# Patient Record
Sex: Male | Born: 1985 | Race: Black or African American | Hispanic: No | Marital: Single | Smoking: Never smoker
Health system: Southern US, Community
[De-identification: ages and names within clinical notes are randomized; demographics above are authoritative.]

## PROBLEM LIST (undated history)

## (undated) DIAGNOSIS — I10 Essential (primary) hypertension: Secondary | ICD-10-CM

## (undated) HISTORY — PX: NO PAST SURGERIES: SHX2092

---
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
## 2006-03-13 ENCOUNTER — Ambulatory Visit: Payer: Self-pay | Admitting: Internal Medicine

## 2006-04-24 ENCOUNTER — Ambulatory Visit: Payer: Self-pay | Admitting: Internal Medicine

## 2006-05-19 ENCOUNTER — Ambulatory Visit: Payer: Self-pay | Admitting: Internal Medicine

## 2006-10-06 DIAGNOSIS — L259 Unspecified contact dermatitis, unspecified cause: Secondary | ICD-10-CM

## 2006-12-14 ENCOUNTER — Emergency Department (HOSPITAL_COMMUNITY): Admission: EM | Admit: 2006-12-14 | Discharge: 2006-12-14 | Payer: Self-pay | Admitting: Emergency Medicine

## 2007-01-21 ENCOUNTER — Emergency Department (HOSPITAL_COMMUNITY): Admission: EM | Admit: 2007-01-21 | Discharge: 2007-01-21 | Payer: Self-pay | Admitting: Emergency Medicine

## 2007-01-25 ENCOUNTER — Encounter (INDEPENDENT_AMBULATORY_CARE_PROVIDER_SITE_OTHER): Payer: Self-pay | Admitting: Internal Medicine

## 2007-01-25 ENCOUNTER — Emergency Department (HOSPITAL_COMMUNITY): Admission: EM | Admit: 2007-01-25 | Discharge: 2007-01-25 | Payer: Self-pay | Admitting: Emergency Medicine

## 2007-01-25 DIAGNOSIS — A539 Syphilis, unspecified: Secondary | ICD-10-CM | POA: Insufficient documentation

## 2007-04-13 ENCOUNTER — Encounter (INDEPENDENT_AMBULATORY_CARE_PROVIDER_SITE_OTHER): Payer: Self-pay | Admitting: *Deleted

## 2007-04-27 ENCOUNTER — Telehealth (INDEPENDENT_AMBULATORY_CARE_PROVIDER_SITE_OTHER): Payer: Self-pay | Admitting: Internal Medicine

## 2007-07-02 ENCOUNTER — Ambulatory Visit: Payer: Self-pay | Admitting: Internal Medicine

## 2007-07-02 DIAGNOSIS — L738 Other specified follicular disorders: Secondary | ICD-10-CM

## 2007-07-02 DIAGNOSIS — F319 Bipolar disorder, unspecified: Secondary | ICD-10-CM

## 2007-07-02 LAB — CONVERTED CEMR LAB
ALT: 11 units/L (ref 0–53)
Alkaline Phosphatase: 75 units/L (ref 39–117)
BUN: 14 mg/dL (ref 6–23)
Basophils Absolute: 0 10*3/uL (ref 0.0–0.1)
Blood in Urine, dipstick: NEGATIVE
CO2: 25 meq/L (ref 19–32)
Eosinophils Absolute: 0.2 10*3/uL (ref 0.0–0.7)
Eosinophils Relative: 3 % (ref 0–5)
Glucose, Urine, Semiquant: NEGATIVE
HCT: 47.1 % (ref 39.0–52.0)
Lymphocytes Relative: 47 % — ABNORMAL HIGH (ref 12–46)
Lymphs Abs: 2.1 10*3/uL (ref 0.7–4.0)
MCHC: 32.3 g/dL (ref 30.0–36.0)
MCV: 84 fL (ref 78.0–100.0)
Monocytes Absolute: 0.5 10*3/uL (ref 0.1–1.0)
Neutrophils Relative %: 38 % — ABNORMAL LOW (ref 43–77)
Nitrite: NEGATIVE
Platelets: 258 10*3/uL (ref 150–400)
RDW: 14.9 % (ref 11.5–15.5)
Sodium: 142 meq/L (ref 135–145)
Total Protein: 7.3 g/dL (ref 6.0–8.3)
WBC: 4.5 10*3/uL (ref 4.0–10.5)
pH: 6

## 2007-07-04 ENCOUNTER — Encounter (INDEPENDENT_AMBULATORY_CARE_PROVIDER_SITE_OTHER): Payer: Self-pay | Admitting: Internal Medicine

## 2007-07-05 ENCOUNTER — Ambulatory Visit: Payer: Self-pay | Admitting: Internal Medicine

## 2007-07-08 ENCOUNTER — Ambulatory Visit: Payer: Self-pay | Admitting: Internal Medicine

## 2007-07-22 ENCOUNTER — Ambulatory Visit: Payer: Self-pay | Admitting: Internal Medicine

## 2007-08-10 DIAGNOSIS — F4310 Post-traumatic stress disorder, unspecified: Secondary | ICD-10-CM

## 2007-08-19 ENCOUNTER — Ambulatory Visit: Payer: Self-pay | Admitting: Internal Medicine

## 2007-08-25 ENCOUNTER — Emergency Department (HOSPITAL_COMMUNITY): Admission: EM | Admit: 2007-08-25 | Discharge: 2007-08-25 | Payer: Self-pay | Admitting: Emergency Medicine

## 2007-09-06 ENCOUNTER — Ambulatory Visit: Payer: Self-pay | Admitting: Internal Medicine

## 2007-12-09 ENCOUNTER — Emergency Department (HOSPITAL_COMMUNITY): Admission: EM | Admit: 2007-12-09 | Discharge: 2007-12-09 | Payer: Self-pay | Admitting: Family Medicine

## 2007-12-26 ENCOUNTER — Emergency Department (HOSPITAL_COMMUNITY): Admission: EM | Admit: 2007-12-26 | Discharge: 2007-12-26 | Payer: Self-pay | Admitting: Emergency Medicine

## 2007-12-29 ENCOUNTER — Emergency Department (HOSPITAL_COMMUNITY): Admission: EM | Admit: 2007-12-29 | Discharge: 2007-12-29 | Payer: Self-pay | Admitting: Emergency Medicine

## 2008-01-08 ENCOUNTER — Emergency Department (HOSPITAL_COMMUNITY): Admission: EM | Admit: 2008-01-08 | Discharge: 2008-01-08 | Payer: Self-pay | Admitting: Emergency Medicine

## 2008-01-20 ENCOUNTER — Emergency Department (HOSPITAL_COMMUNITY): Admission: EM | Admit: 2008-01-20 | Discharge: 2008-01-20 | Payer: Self-pay | Admitting: Family Medicine

## 2008-04-05 ENCOUNTER — Emergency Department (HOSPITAL_COMMUNITY): Admission: EM | Admit: 2008-04-05 | Discharge: 2008-04-05 | Payer: Self-pay | Admitting: Emergency Medicine

## 2008-06-04 ENCOUNTER — Emergency Department (HOSPITAL_COMMUNITY): Admission: EM | Admit: 2008-06-04 | Discharge: 2008-06-04 | Payer: Self-pay | Admitting: Emergency Medicine

## 2008-06-20 ENCOUNTER — Emergency Department (HOSPITAL_COMMUNITY): Admission: EM | Admit: 2008-06-20 | Discharge: 2008-06-20 | Payer: Self-pay | Admitting: Emergency Medicine

## 2008-06-22 ENCOUNTER — Emergency Department (HOSPITAL_COMMUNITY): Admission: EM | Admit: 2008-06-22 | Discharge: 2008-06-22 | Payer: Self-pay | Admitting: Emergency Medicine

## 2008-07-06 ENCOUNTER — Encounter (INDEPENDENT_AMBULATORY_CARE_PROVIDER_SITE_OTHER): Payer: Self-pay | Admitting: Internal Medicine

## 2009-04-29 ENCOUNTER — Emergency Department (HOSPITAL_COMMUNITY): Admission: EM | Admit: 2009-04-29 | Discharge: 2009-04-29 | Payer: Self-pay | Admitting: Family Medicine

## 2009-07-07 ENCOUNTER — Emergency Department (HOSPITAL_COMMUNITY): Admission: EM | Admit: 2009-07-07 | Discharge: 2009-07-07 | Payer: Self-pay | Admitting: Emergency Medicine

## 2009-07-17 ENCOUNTER — Emergency Department (HOSPITAL_COMMUNITY): Admission: EM | Admit: 2009-07-17 | Discharge: 2009-07-17 | Payer: Self-pay | Admitting: Family Medicine

## 2009-09-04 IMAGING — CR DG CHEST 2V
2 series · 2 of 2 positions shown · non-contrast
Comparison: None

CLINICAL DATA: Chest pain cough.

CHEST - 2 VIEW

[w chest pa]
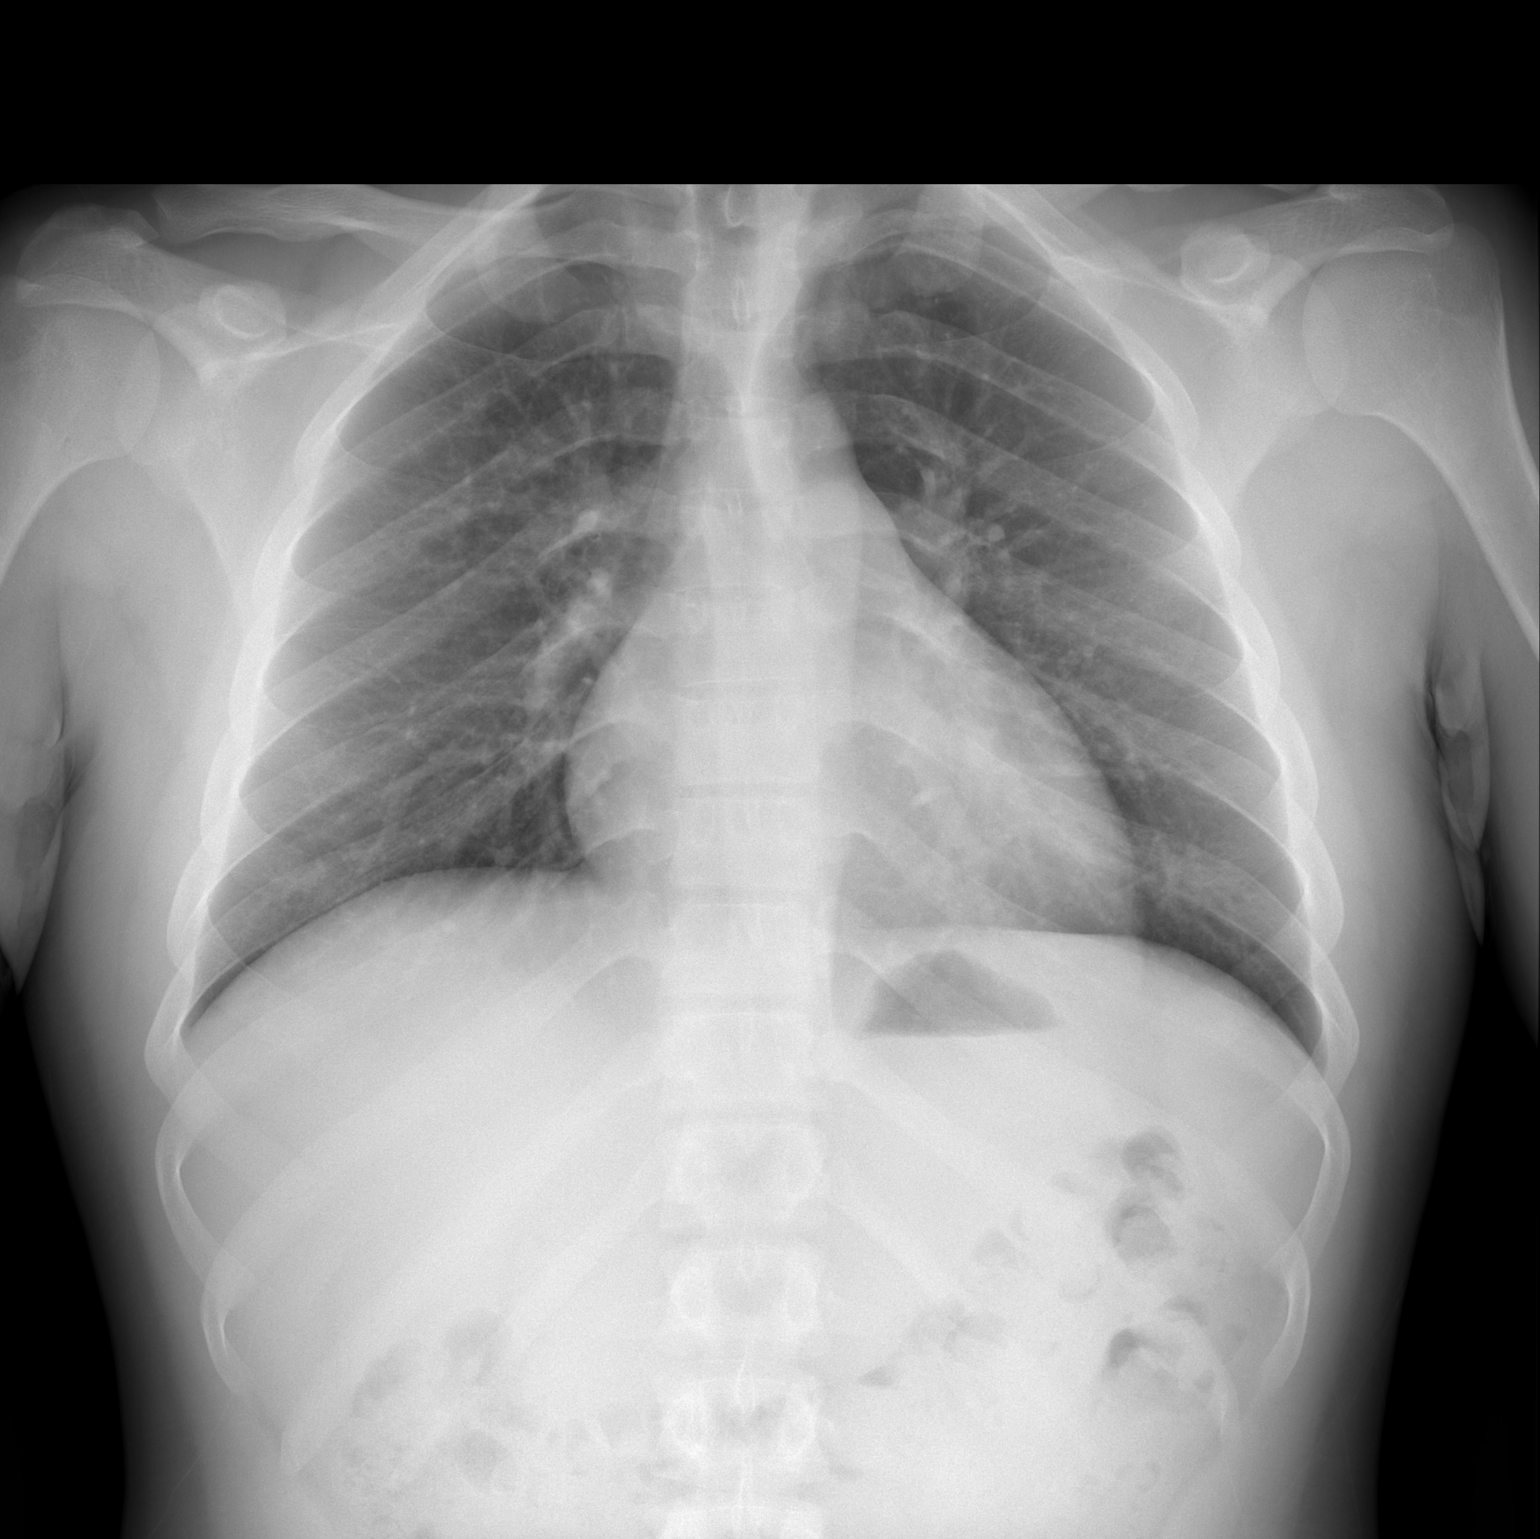

[w chest lat]
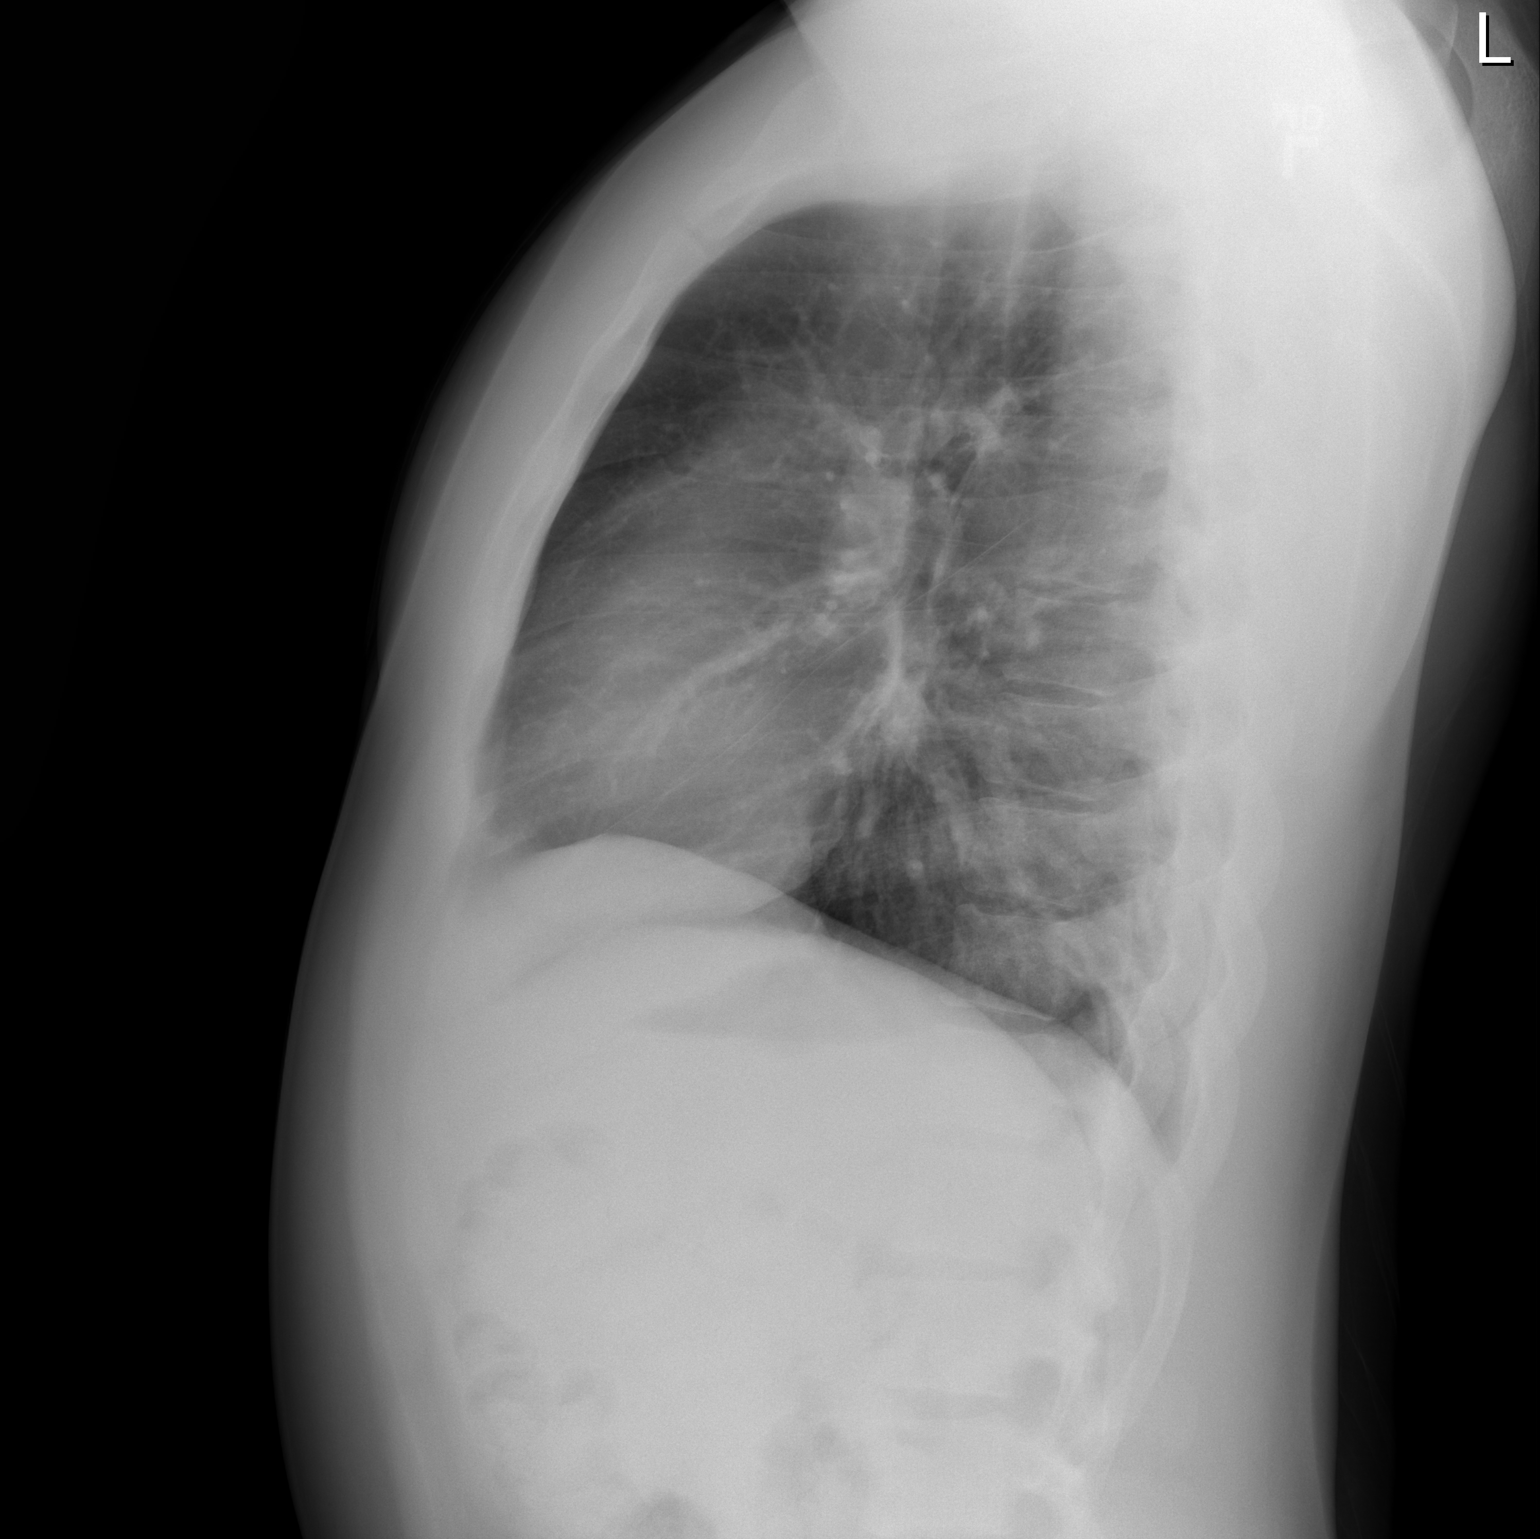

[2 of 2 positions shown; findings below may reference images not displayed]

FINDINGS: The heart size and mediastinal contours are within normal
limits.  Both lungs are clear.  The visualized skeletal structures
are unremarkable.
IMPRESSION: No active cardiopulmonary disease.
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
## 2010-04-10 ENCOUNTER — Emergency Department (HOSPITAL_COMMUNITY)
Admission: EM | Admit: 2010-04-10 | Discharge: 2010-04-10 | Discharge status: Against Medical Advice | Payer: Self-pay | Attending: Emergency Medicine | Admitting: Emergency Medicine

## 2010-04-10 ENCOUNTER — Emergency Department (HOSPITAL_COMMUNITY): Payer: Self-pay

## 2010-04-10 DIAGNOSIS — S01409A Unspecified open wound of unspecified cheek and temporomandibular area, initial encounter: Secondary | ICD-10-CM | POA: Insufficient documentation

## 2010-04-10 DIAGNOSIS — IMO0002 Reserved for concepts with insufficient information to code with codable children: Secondary | ICD-10-CM | POA: Insufficient documentation

## 2010-11-01 LAB — POCT URINALYSIS DIP (DEVICE)
Glucose, UA: NEGATIVE
Hgb urine dipstick: NEGATIVE
Operator id: 239701
Protein, ur: NEGATIVE

## 2010-11-06 LAB — URINALYSIS, ROUTINE W REFLEX MICROSCOPIC
Bilirubin Urine: NEGATIVE
Glucose, UA: NEGATIVE
Hgb urine dipstick: NEGATIVE
Ketones, ur: NEGATIVE
Nitrite: NEGATIVE
Protein, ur: NEGATIVE
pH: 6.5

## 2010-11-08 LAB — HIV ANTIBODY (ROUTINE TESTING W REFLEX): HIV: NONREACTIVE

## 2010-11-08 LAB — GC/CHLAMYDIA PROBE AMP, GENITAL: Chlamydia, DNA Probe: NEGATIVE

## 2010-11-08 LAB — T.PALLIDUM AB, IGG: T pallidum Antibodies (TP-PA): >8 — ABNORMAL HIGH

## 2010-11-08 LAB — RPR: RPR Ser Ql: REACTIVE — AB

## 2010-11-08 LAB — HERPES SIMPLEX VIRUS CULTURE: Culture: NOT DETECTED

## 2012-10-31 ENCOUNTER — Encounter (HOSPITAL_COMMUNITY): Payer: Self-pay | Admitting: *Deleted

## 2012-10-31 ENCOUNTER — Emergency Department (HOSPITAL_COMMUNITY)
Admission: EM | Admit: 2012-10-31 | Discharge: 2012-10-31 | Disposition: A | Discharge status: Home / Self Care | Payer: Self-pay | Attending: Emergency Medicine | Admitting: Emergency Medicine

## 2012-10-31 DIAGNOSIS — R319 Hematuria, unspecified: Secondary | ICD-10-CM | POA: Insufficient documentation

## 2012-10-31 DIAGNOSIS — Z880 Allergy status to penicillin: Secondary | ICD-10-CM | POA: Insufficient documentation

## 2012-10-31 DIAGNOSIS — R000 Tachycardia, unspecified: Secondary | ICD-10-CM | POA: Insufficient documentation

## 2012-10-31 DIAGNOSIS — R109 Unspecified abdominal pain: Secondary | ICD-10-CM | POA: Insufficient documentation

## 2012-10-31 DIAGNOSIS — M549 Dorsalgia, unspecified: Secondary | ICD-10-CM | POA: Insufficient documentation

## 2012-10-31 LAB — URINALYSIS, ROUTINE W REFLEX MICROSCOPIC
Bilirubin Urine: NEGATIVE
Ketones, ur: NEGATIVE mg/dL
Leukocytes, UA: NEGATIVE
Specific Gravity, Urine: 1.035 — ABNORMAL HIGH (ref 1.005–1.030)
Urobilinogen, UA: 1 mg/dL (ref 0.0–1.0)

## 2012-10-31 LAB — URINE MICROSCOPIC-ADD ON

## 2012-10-31 MED ORDER — KETOROLAC TROMETHAMINE 60 MG/2ML IM SOLN
60.0000 mg | Freq: Once | INTRAMUSCULAR | Status: AC
  Administered 2012-10-31: 60 mg via INTRAMUSCULAR
  Filled 2012-10-31: qty 2

## 2012-10-31 MED ORDER — LIDOCAINE HCL (PF) 1 % IJ SOLN: 2.0000 mL | Freq: Once | INTRAMUSCULAR | Status: DC

## 2012-10-31 MED ORDER — HYDROCODONE-ACETAMINOPHEN 5-325 MG PO TABS
2.0000 | ORAL_TABLET | Freq: Once | ORAL | Status: AC
  Administered 2012-10-31: 2 via ORAL
  Filled 2012-10-31: qty 2

## 2012-10-31 MED ORDER — CEFTRIAXONE SODIUM 250 MG IJ SOLR
250.0000 mg | Freq: Once | INTRAMUSCULAR | Status: AC
  Administered 2012-10-31: 250 mg via INTRAMUSCULAR
  Filled 2012-10-31: qty 250

## 2012-10-31 MED ORDER — HYDROCODONE-ACETAMINOPHEN 5-325 MG PO TABS: 1.0000 | ORAL_TABLET | ORAL | Status: DC | PRN

## 2012-10-31 NOTE — ED Notes (Signed)
Left flank pain x 2 days ago.- felt like it was cramps. 0400: hematuria.

## 2012-10-31 NOTE — ED Provider Notes (Signed)
CSN: [id number]     Arrival date & time 10/31/12  0602 History   First MD Initiated Contact with Patient 10/31/12 0606     Chief Complaint  Patient presents with  . Hematuria   (Consider location/radiation/quality/duration/timing/severity/associated sxs/prior Treatment) HPI Comments: 27 yo male with syphilis hx presents with left flank pain and dysuria for two days.  Pt has had an std but this is different> no known or FH of kidney stones.  Pain sharp, radiates to groin.  Improved recently.  Blood in urine, new.  Pt is sexually active rectal penetration, one partner.  No discharge or fevers.   Patient is a 27 y.o. male presenting with hematuria. The history is provided by the patient.  Hematuria This is a new problem. Pertinent negatives include no abdominal pain.    No past medical history on file. No past surgical history on file. No family history on file. History  Substance Use Topics  . Smoking status: Not on file  . Smokeless tobacco: Not on file  . Alcohol Use: Not on file    Review of Systems  Constitutional: Negative for fever and chills.  Gastrointestinal: Negative for vomiting and abdominal pain.  Genitourinary: Positive for hematuria and flank pain. Negative for discharge, penile swelling, scrotal swelling and penile pain.  Musculoskeletal: Positive for back pain.  Skin: Negative for rash.    Allergies  Penicillins  Home Medications  No current outpatient prescriptions on file. BP 148/93  Pulse 103  Temp(Src) 98.3 F (36.8 C) (Oral)  Resp 18  SpO2 99% Physical Exam  Nursing note and vitals reviewed. Constitutional: He is oriented to person, place, and time. He appears well-developed and well-nourished.  HENT:  Head: Normocephalic and atraumatic.  Eyes: Conjunctivae are normal. Right eye exhibits no discharge. Left eye exhibits no discharge.  Neck: Normal range of motion. Neck supple. No tracheal deviation present.  Cardiovascular: Regular rhythm.   Tachycardia present.   Pulmonary/Chest: Effort normal.  Abdominal: Soft. He exhibits no distension. There is no tenderness. There is no guarding.  Left flank pain  Neurological: He is alert and oriented to person, place, and time.  Skin: Skin is warm. No rash noted.  Psychiatric: He has a normal mood and affect.    ED Course  Procedures (including critical care time) Emergency Focused Ultrasound Exam Limited retroperitoneal ultrasound of kidneys  Performed and interpreted by Dr. Jodi Mourning Indication: left flank pain Focused abdominal ultrasound with both kidneys imaged in transverse and longitudinal planes in real-time. Interpretation: no hydronephrosis visualized.  no stones or cysts visualized  Images archived electronically  Labs Review Labs Reviewed  URINALYSIS, ROUTINE W REFLEX MICROSCOPIC - Abnormal; Notable for the following:    APPearance CLOUDY (*)    Specific Gravity, Urine 1.035 (*)    Hgb urine dipstick MODERATE (*)    Protein, ur 30 (*)    All other components within normal limits  URINE MICROSCOPIC-ADD ON   Imaging Review No results found.  MDM  No diagnosis found. Clinically stone, may have passed. With std hx, rocephin given prophylactic IM. Pt will fup closely with pcp.  Pt comfortable on exam/ Toradol and norco for pain. Pain improved in ED.  No infection in UA.  Culture sent.  Bedside US no hydronephrosis, with pain improved, young male, left side. Will hold on CT at this point. Strict return instructions given.   Flank pain, hematuria   Enid Skeens, MD 10/31/12 [phone]

## 2013-08-09 ENCOUNTER — Encounter (HOSPITAL_COMMUNITY): Payer: Self-pay | Admitting: Emergency Medicine

## 2013-08-09 ENCOUNTER — Emergency Department (HOSPITAL_COMMUNITY)
Admission: EM | Admit: 2013-08-09 | Discharge: 2013-08-09 | Disposition: A | Discharge status: Home / Self Care | Payer: No Typology Code available for payment source | Attending: Emergency Medicine | Admitting: Emergency Medicine

## 2013-08-09 ENCOUNTER — Emergency Department (HOSPITAL_COMMUNITY): Payer: No Typology Code available for payment source

## 2013-08-09 DIAGNOSIS — S99919A Unspecified injury of unspecified ankle, initial encounter: Secondary | ICD-10-CM

## 2013-08-09 DIAGNOSIS — Y9389 Activity, other specified: Secondary | ICD-10-CM | POA: Diagnosis not present

## 2013-08-09 DIAGNOSIS — Y92410 Unspecified street and highway as the place of occurrence of the external cause: Secondary | ICD-10-CM | POA: Diagnosis not present

## 2013-08-09 DIAGNOSIS — S199XXA Unspecified injury of neck, initial encounter: Secondary | ICD-10-CM

## 2013-08-09 DIAGNOSIS — Z880 Allergy status to penicillin: Secondary | ICD-10-CM | POA: Diagnosis not present

## 2013-08-09 DIAGNOSIS — IMO0002 Reserved for concepts with insufficient information to code with codable children: Secondary | ICD-10-CM | POA: Diagnosis present

## 2013-08-09 DIAGNOSIS — S99929A Unspecified injury of unspecified foot, initial encounter: Secondary | ICD-10-CM

## 2013-08-09 DIAGNOSIS — M25562 Pain in left knee: Secondary | ICD-10-CM

## 2013-08-09 DIAGNOSIS — S0993XA Unspecified injury of face, initial encounter: Secondary | ICD-10-CM | POA: Insufficient documentation

## 2013-08-09 DIAGNOSIS — S0990XA Unspecified injury of head, initial encounter: Secondary | ICD-10-CM | POA: Diagnosis not present

## 2013-08-09 DIAGNOSIS — S8990XA Unspecified injury of unspecified lower leg, initial encounter: Secondary | ICD-10-CM | POA: Insufficient documentation

## 2013-08-09 DIAGNOSIS — M545 Low back pain: Secondary | ICD-10-CM

## 2013-08-09 MED ORDER — DIAZEPAM 5 MG PO TABS
10.0000 mg | ORAL_TABLET | Freq: Once | ORAL | Status: AC
  Administered 2013-08-09: 10 mg via ORAL
  Filled 2013-08-09: qty 2

## 2013-08-09 MED ORDER — DIAZEPAM 5 MG PO TABS: 5.0000 mg | ORAL_TABLET | Freq: Two times a day (BID) | ORAL | Status: DC

## 2013-08-09 MED ORDER — NAPROXEN 500 MG PO TABS
500.0000 mg | ORAL_TABLET | Freq: Once | ORAL | Status: AC
  Administered 2013-08-09: 500 mg via ORAL
  Filled 2013-08-09: qty 1

## 2013-08-09 MED ORDER — NAPROXEN 500 MG PO TABS: 500.0000 mg | ORAL_TABLET | Freq: Two times a day (BID) | ORAL | Status: DC
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
## 2013-08-09 NOTE — Progress Notes (Signed)
[facility] provided pt with a list of primary care resources and a [facility] [employer] Card application to help patient establish primary care.  °
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
## 2013-08-09 NOTE — ED Notes (Signed)
Ortho tech at bedside 

## 2013-08-10 NOTE — ED Provider Notes (Signed)
Medical screening examination/treatment/procedure(s) were performed by non-physician practitioner and as supervising physician I was immediately available for consultation/collaboration.   EKG Interpretation None        William Jens Siems, MD 08/10/13 1640 

## 2013-08-11 ENCOUNTER — Emergency Department (HOSPITAL_COMMUNITY)
Admission: EM | Admit: 2013-08-11 | Discharge: 2013-08-11 | Disposition: A | Discharge status: Home / Self Care | Payer: No Typology Code available for payment source | Attending: Emergency Medicine | Admitting: Emergency Medicine

## 2013-08-11 ENCOUNTER — Emergency Department (HOSPITAL_COMMUNITY): Payer: No Typology Code available for payment source

## 2013-08-11 ENCOUNTER — Encounter (HOSPITAL_COMMUNITY): Payer: Self-pay | Admitting: Emergency Medicine

## 2013-08-11 DIAGNOSIS — G8911 Acute pain due to trauma: Secondary | ICD-10-CM | POA: Diagnosis not present

## 2013-08-11 DIAGNOSIS — M25569 Pain in unspecified knee: Secondary | ICD-10-CM | POA: Diagnosis not present

## 2013-08-11 DIAGNOSIS — R519 Headache, unspecified: Secondary | ICD-10-CM

## 2013-08-11 DIAGNOSIS — R51 Headache: Secondary | ICD-10-CM | POA: Diagnosis not present

## 2013-08-11 DIAGNOSIS — G479 Sleep disorder, unspecified: Secondary | ICD-10-CM | POA: Diagnosis not present

## 2013-08-11 DIAGNOSIS — S0232XA Fracture of orbital floor, left side, initial encounter for closed fracture: Secondary | ICD-10-CM

## 2013-08-11 DIAGNOSIS — Z880 Allergy status to penicillin: Secondary | ICD-10-CM | POA: Diagnosis not present

## 2013-08-11 DIAGNOSIS — S0993XA Unspecified injury of face, initial encounter: Secondary | ICD-10-CM | POA: Diagnosis present

## 2013-08-11 DIAGNOSIS — M542 Cervicalgia: Secondary | ICD-10-CM

## 2013-08-11 DIAGNOSIS — Z791 Long term (current) use of non-steroidal anti-inflammatories (NSAID): Secondary | ICD-10-CM | POA: Diagnosis not present

## 2013-08-11 DIAGNOSIS — Y929 Unspecified place or not applicable: Secondary | ICD-10-CM | POA: Diagnosis not present

## 2013-08-11 DIAGNOSIS — M545 Low back pain, unspecified: Secondary | ICD-10-CM | POA: Insufficient documentation

## 2013-08-11 DIAGNOSIS — S0230XA Fracture of orbital floor, unspecified side, initial encounter for closed fracture: Secondary | ICD-10-CM | POA: Insufficient documentation

## 2013-08-11 DIAGNOSIS — M25562 Pain in left knee: Secondary | ICD-10-CM

## 2013-08-11 DIAGNOSIS — Z79899 Other long term (current) drug therapy: Secondary | ICD-10-CM | POA: Diagnosis not present

## 2013-08-11 DIAGNOSIS — Y9389 Activity, other specified: Secondary | ICD-10-CM | POA: Diagnosis not present

## 2013-08-11 MED ORDER — METOCLOPRAMIDE HCL 5 MG/ML IJ SOLN
10.0000 mg | Freq: Once | INTRAMUSCULAR | Status: DC
  Filled 2013-08-11: qty 2

## 2013-08-11 MED ORDER — DIPHENHYDRAMINE HCL 50 MG/ML IJ SOLN
25.0000 mg | Freq: Once | INTRAMUSCULAR | Status: DC
  Filled 2013-08-11: qty 1

## 2013-08-11 MED ORDER — OXYCODONE-ACETAMINOPHEN 5-325 MG PO TABS: 1.0000 | ORAL_TABLET | ORAL | Status: DC | PRN

## 2013-08-11 MED ORDER — SODIUM CHLORIDE 0.9 % IV BOLUS (SEPSIS)
1000.0000 mL | Freq: Once | INTRAVENOUS | Status: AC
  Administered 2013-08-11: 1000 mL via INTRAVENOUS

## 2013-08-11 MED ORDER — LEVOFLOXACIN 750 MG PO TABS: 750.0000 mg | ORAL_TABLET | Freq: Every day | ORAL | Status: DC

## 2013-08-11 MED ORDER — OXYCODONE-ACETAMINOPHEN 5-325 MG PO TABS
2.0000 | ORAL_TABLET | Freq: Once | ORAL | Status: AC
  Administered 2013-08-11: 2 via ORAL
  Filled 2013-08-11: qty 2
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
## 2013-08-11 NOTE — Discharge Instructions (Signed)
Take percocet for severe pain - Please be careful with this medication.  It can cause drowsiness.  Use caution while driving, operating machinery, drinking alcohol, or any other activities that may impair your physical or mental abilities.   DO NOT BLOW YOUR NOSE or sneeze  Take antibiotic for full dose  Continue to use RICE method  Return to the emergency department if you develop any changing/worsening condition, worsening pain, or any other concerns (please read additional information regarding your condition below)    Orbital Floor Fracture, Blowout The eye sits in the bony structure of the skull called the orbit. The upper and outside walls of the orbit are very thick and strong. These walls protect the eye if the head is struck from the top or side of the eye. However, the inside wall near the nose and the orbit floor are very thin and weak. The bony floor of the orbit also acts as the roof of the air-filled space (sinus) below the orbit. If the eye receives a direct blow from the front, all the tissues around the eye are briefly pressed together. This makes the orbital wall pressure very high. Since the weakest walls tend to give way first, the inside wall or the orbit floor may break. If the floor fractures, the tissues around the eye, including the muscle that is used to make the eye look down, may become trapped within the fracture as the floor of the orbit "blows out" into the sinus below.  CAUSES  Orbital floor fractures are caused by direct (blunt) trauma to the region of the eye. SYMPTOMS  Assuming that there has been no injury to the eye itself, symptoms can include:  Puffiness (swelling) and bruising around the eye area (black eye).  A gurgling sound when pressure is placed on the eye area. This sound comes from air that has escaped from the sinus into the space around the eye (orbital emphysema).  Seeing two of everything - one object being higher than the other (vertical  diplopia). This is the result of the muscle that moves the eye down being trapped within the fracture. Since it cannot relax, the eye is being held in a downward position relative to the other eye and cannot look up. Vertical diplopia from an orbital floor fracture is worse when looking up.  Pain around the eye when looking up.  One eye looks sunken compared to the other eye (enophthalmos).  Numbness of the cheek and upper gum on the same side of the face with the floor fracture. This is a result of nerve injury to these areas. This nerve runs in a groove along the bone of the orbital floor on its way to the cheek and upper gums. DIAGNOSIS  The diagnosis of an orbital floor fracture is suspected during an eye exam by an ophthalmologist. It is confirmed by X-rays or CT scan of the eye region. TREATMENT   Orbital floor fractures are not usually treated until all of the swelling around the eye has gone away. This may take 1 or 2 weeks. Once the swelling has gone down, an ophthalmologist will see if if the muscle below the eye is still trapped within the fracture.  If there is no sign of a trapped muscle or vertical diplopia, treatment is not necessary.  If there is double vision only when looking up, a decision may be made to not do anything since most people do not spend a lot of time looking up. This may  depend on the person's profession. For instance, a Nutritional therapist or electrician may spend a large part of their day looking up and would therefore need treatment.  If there is persistent vertical double vision even when looking straight ahead, the ophthalmologist may try to free the muscle in the office. If this is unsuccessful, surgery is often needed. SEEK IMMEDIATE MEDICAL CARE IF:  You have had a blow to the region of your eyes and have:  A drop in vision in either eye.  Swelling and bruising around either eye.  One eye seems to be "sunken" compared to the other.  You see two of everything  with both eyes open when looking in any direction.  The two images get further apart when looking in a certain direction - especially up.  You have numbness of the cheek and upper gums on the side of the injury.  You develop an unexplained oral temperature over 102 F (38.9 C), or as your caregiver suggests. Document Released: 07/16/2000 Document Revised: 04/14/2011 Document Reviewed: 03/07/2011 [facility] Patient Information 2015 [place], [place]. This information is not intended to replace advice given to you by your health care provider. Make sure you discuss any questions you have with your health care provider.  General Headache Without Cause A headache is pain or discomfort felt around the head or neck area. The specific cause of a headache may not be found. There are many causes and types of headaches. A few common ones are: Tension headaches. Migraine headaches. Cluster headaches. Chronic daily headaches. HOME CARE INSTRUCTIONS  Keep all follow-up appointments with your caregiver or any specialist referral. Only take over-the-counter or prescription medicines for pain or discomfort as directed by your caregiver. Lie down in a dark, quiet room when you have a headache. Keep a headache journal to find out what may trigger your migraine headaches. For example, write down: What you eat and drink. How much sleep you get. Any change to your diet or medicines. Try massage or other relaxation techniques. Put ice packs or heat on the head and neck. Use these 3 to 4 times per day for 15 to 20 minutes each time, or as needed. Limit stress. Sit up straight, and do not tense your muscles. Quit smoking if you smoke. Limit alcohol use. Decrease the amount of caffeine you drink, or stop drinking caffeine. Eat and sleep on a regular schedule. Get 7 to 9 hours of sleep, or as recommended by your caregiver. Keep lights dim if bright lights bother you and make your headaches worse. SEEK MEDICAL  CARE IF:  You have problems with the medicines you were prescribed. Your medicines are not working. You have a change from the usual headache. You have nausea or vomiting. SEEK IMMEDIATE MEDICAL CARE IF:  Your headache becomes severe. You have a fever. You have a stiff neck. You have loss of vision. You have muscular weakness or loss of muscle control. You start losing your balance or have trouble walking. You feel faint or pass out. You have severe symptoms that are different from your first symptoms. MAKE SURE YOU:  Understand these instructions. Will watch your condition. Will get help right away if you are not doing well or get worse. Document Released: 01/20/2005 Document Revised: 04/14/2011 Document Reviewed: 02/05/2011 [facility] Patient Information 2015 [place], [place]. This information is not intended to replace advice given to you by your health care provider. Make sure you discuss any questions you have with your health care provider.   Cervical Sprain  A cervical sprain is an injury in the neck in which the strong, fibrous tissues (ligaments) that connect your neck bones stretch or tear. Cervical sprains can range from mild to severe. Severe cervical sprains can cause the neck vertebrae to be unstable. This can lead to damage of the spinal cord and can result in serious nervous system problems. The amount of time it takes for a cervical sprain to get better depends on the cause and extent of the injury. Most cervical sprains heal in 1 to 3 weeks. CAUSES  Severe cervical sprains may be caused by:   Contact sport injuries (such as from football, rugby, wrestling, hockey, auto racing, gymnastics, diving, martial arts, or boxing).   Motor vehicle collisions.   Whiplash injuries. This is an injury from a sudden forward and backward whipping movement of the head and neck.  Falls.  Mild cervical sprains may be caused by:   Being in an awkward position, such as while cradling  a telephone between your ear and shoulder.   Sitting in a chair that does not offer proper support.   Working at a poorly Marketing executivedesigned computer station.   Looking up or down for long periods of time.  SYMPTOMS   Pain, soreness, stiffness, or a burning sensation in the front, back, or sides of the neck. This discomfort may develop immediately after the injury or slowly, 24 hours or more after the injury.   Pain or tenderness directly in the middle of the back of the neck.   Shoulder or upper back pain.   Limited ability to move the neck.   Headache.   Dizziness.   Weakness, numbness, or tingling in the hands or arms.   Muscle spasms.   Difficulty swallowing or chewing.   Tenderness and swelling of the neck.  DIAGNOSIS  Most of the time your health care provider can diagnose a cervical sprain by taking your history and doing a physical exam. Your health care provider will ask about previous neck injuries and any known neck problems, such as arthritis in the neck. X-rays may be taken to find out if there are any other problems, such as with the bones of the neck. Other tests, such as a CT scan or MRI, may also be needed.  TREATMENT  Treatment depends on the severity of the cervical sprain. Mild sprains can be treated with rest, keeping the neck in place (immobilization), and pain medicines. Severe cervical sprains are immediately immobilized. Further treatment is done to help with pain, muscle spasms, and other symptoms and may include:  Medicines, such as pain relievers, numbing medicines, or muscle relaxants.   Physical therapy. This may involve stretching exercises, strengthening exercises, and posture training. Exercises and improved posture can help stabilize the neck, strengthen muscles, and help stop symptoms from returning.  HOME CARE INSTRUCTIONS   Put ice on the injured area.   Put ice in a plastic bag.   Place a towel between your skin and the bag.    Leave the ice on for 15-20 minutes, 3-4 times a day.   If your injury was severe, you may have been given a cervical collar to wear. A cervical collar is a two-piece collar designed to keep your neck from moving while it heals.  Do not remove the collar unless instructed by your health care provider.  If you have long hair, keep it outside of the collar.  Ask your health care provider before making any adjustments to your collar. Minor adjustments  may be required over time to improve comfort and reduce pressure on your chin or on the back of your head.  Ifyou are allowed to remove the collar for cleaning or bathing, follow your health care provider's instructions on how to do so safely.  Keep your collar clean by wiping it with mild soap and water and drying it completely. If the collar you have been given includes removable pads, remove them every 1-2 days and hand wash them with soap and water. Allow them to air dry. They should be completely dry before you wear them in the collar.  If you are allowed to remove the collar for cleaning and bathing, wash and dry the skin of your neck. Check your skin for irritation or sores. If you see any, tell your health care provider.  Do not drive while wearing the collar.   Only take over-the-counter or prescription medicines for pain, discomfort, or fever as directed by your health care provider.   Keep all follow-up appointments as directed by your health care provider.   Keep all physical therapy appointments as directed by your health care provider.   Make any needed adjustments to your workstation to promote good posture.   Avoid positions and activities that make your symptoms worse.   Warm up and stretch before being active to help prevent problems.  SEEK MEDICAL CARE IF:   Your pain is not controlled with medicine.   You are unable to decrease your pain medicine over time as planned.   Your activity level is not  improving as expected.  SEEK IMMEDIATE MEDICAL CARE IF:   You develop any bleeding.  You develop stomach upset.  You have signs of an allergic reaction to your medicine.   Your symptoms get worse.   You develop new, unexplained symptoms.   You have numbness, tingling, weakness, or paralysis in any part of your body.  MAKE SURE YOU:   Understand these instructions.  Will watch your condition.  Will get help right away if you are not doing well or get worse. Document Released: 11/17/2006 Document Revised: 01/25/2013 Document Reviewed: 07/28/2012 [facility] Patient Information 2015 [place], [place]. This information is not intended to replace advice given to you by your health care provider. Make sure you discuss any questions you have with your health care provider.  RICE: Routine Care for Injuries The routine care of many injuries includes Rest, Ice, Compression, and Elevation (RICE). HOME CARE INSTRUCTIONS  Rest is needed to allow your body to heal. Routine activities can usually be resumed when comfortable. Injured tendons and bones can take up to 6 weeks to heal. Tendons are the cord-like structures that attach muscle to bone.  Ice following an injury helps keep the swelling down and reduces pain.  Put ice in a plastic bag.  Place a towel between your skin and the bag.  Leave the ice on for 15-20 minutes, 3-4 times a day, or as directed by your health care provider. Do this while awake, for the first 24 to 48 hours. After that, continue as directed by your caregiver.  Compression helps keep swelling down. It also gives support and helps with discomfort. If an elastic bandage has been applied, it should be removed and reapplied every 3 to 4 hours. It should not be applied tightly, but firmly enough to keep swelling down. Watch fingers or toes for swelling, bluish discoloration, coldness, numbness, or excessive pain. If any of these problems occur, remove the bandage and  reapply  loosely. Contact your caregiver if these problems continue.  Elevation helps reduce swelling and decreases pain. With extremities, such as the arms, hands, legs, and feet, the injured area should be placed near or above the level of the heart, if possible. SEEK IMMEDIATE MEDICAL CARE IF:  You have persistent pain and swelling.  You develop redness, numbness, or unexpected weakness.  Your symptoms are getting worse rather than improving after several days. These symptoms may indicate that further evaluation or further X-rays are needed. Sometimes, X-rays may not show a small broken bone (fracture) until 1 week or 10 days later. Make a follow-up appointment with your caregiver. Ask when your X-ray results will be ready. Make sure you get your X-ray results. Document Released: 05/04/2000 Document Revised: 01/25/2013 Document Reviewed: 06/21/2010 [facility] Patient Information 2015 [place], [place]. This information is not intended to replace advice given to you by your health care provider. Make sure you discuss any questions you have with your health care provider.  Musculoskeletal Pain Musculoskeletal pain is muscle and boney aches and pains. These pains can occur in any part of the body. Your caregiver may treat you without knowing the cause of the pain. They may treat you if blood or urine tests, X-rays, and other tests were normal.  CAUSES There is often not a definite cause or reason for these pains. These pains may be caused by a type of germ (virus). The discomfort may also come from overuse. Overuse includes working out too hard when your body is not fit. Boney aches also come from weather changes. Bone is sensitive to atmospheric pressure changes. HOME CARE INSTRUCTIONS   Ask when your test results will be ready. Make sure you get your test results.  Only take over-the-counter or prescription medicines for pain, discomfort, or fever as directed by your caregiver. If you were given  medications for your condition, do not drive, operate machinery or power tools, or sign legal documents for 24 hours. Do not drink alcohol. Do not take sleeping pills or other medications that may interfere with treatment.  Continue all activities unless the activities cause more pain. When the pain lessens, slowly resume normal activities. Gradually increase the intensity and duration of the activities or exercise.  During periods of severe pain, bed rest may be helpful. Lay or sit in any position that is comfortable.  Putting ice on the injured area.  Put ice in a bag.  Place a towel between your skin and the bag.  Leave the ice on for 15 to 20 minutes, 3 to 4 times a day.  Follow up with your caregiver for continued problems and no reason can be found for the pain. If the pain becomes worse or does not go away, it may be necessary to repeat tests or do additional testing. Your caregiver may need to look further for a possible cause. SEEK IMMEDIATE MEDICAL CARE IF:  You have pain that is getting worse and is not relieved by medications.  You develop chest pain that is associated with shortness or breath, sweating, feeling sick to your stomach (nauseous), or throw up (vomit).  Your pain becomes localized to the abdomen.  You develop any new symptoms that seem different or that concern you. MAKE SURE YOU:   Understand these instructions.  Will watch your condition.  Will get help right away if you are not doing well or get worse. Document Released: 01/20/2005 Document Revised: 04/14/2011 Document Reviewed: 09/24/2012 [facility] Patient Information 2015 [place], [place]. This information  is not intended to replace advice given to you by your health care provider. Make sure you discuss any questions you have with your health care provider.  Back Pain, Adult Low back pain is very common. About 1 in 5 people have back pain.The cause of low back pain is rarely dangerous. The pain often  gets better over time.About half of people with a sudden onset of back pain feel better in just 2 weeks. About 8 in 10 people feel better by 6 weeks.  CAUSES Some common causes of back pain include:  Strain of the muscles or ligaments supporting the spine.  Wear and tear (degeneration) of the spinal discs.  Arthritis.  Direct injury to the back. DIAGNOSIS Most of the time, the direct cause of low back pain is not known.However, back pain can be treated effectively even when the exact cause of the pain is unknown.Answering your caregiver's questions about your overall health and symptoms is one of the most accurate ways to make sure the cause of your pain is not dangerous. If your caregiver needs more information, he or she may order lab work or imaging tests (X-rays or MRIs).However, even if imaging tests show changes in your back, this usually does not require surgery. HOME CARE INSTRUCTIONS For many people, back pain returns.Since low back pain is rarely dangerous, it is often a condition that people can learn to [facility] their own.   Remain active. It is stressful on the back to sit or stand in one place. Do not sit, drive, or stand in one place for more than 30 minutes at a time. Take short walks on level surfaces as soon as pain allows.Try to increase the length of time you walk each day.  Do not stay in bed.Resting more than 1 or 2 days can delay your recovery.  Do not avoid exercise or work.Your body is made to move.It is not dangerous to be active, even though your back may hurt.Your back will likely heal faster if you return to being active before your pain is gone.  Pay attention to your body when you bend and lift. Many people have less discomfortwhen lifting if they bend their knees, keep the load close to their bodies,and avoid twisting. Often, the most comfortable positions are those that put less stress on your recovering back.  Find a comfortable position to  sleep. Use a firm mattress and lie on your side with your knees slightly bent. If you lie on your back, put a pillow under your knees.  Only take over-the-counter or prescription medicines as directed by your caregiver. Over-the-counter medicines to reduce pain and inflammation are often the most helpful.Your caregiver may prescribe muscle relaxant drugs.These medicines help dull your pain so you can more quickly return to your normal activities and healthy exercise.  Put ice on the injured area.  Put ice in a plastic bag.  Place a towel between your skin and the bag.  Leave the ice on for 15-20 minutes, 03-04 times a day for the first 2 to 3 days. After that, ice and heat may be alternated to reduce pain and spasms.  Ask your caregiver about trying back exercises and gentle massage. This may be of some benefit.  Avoid feeling anxious or stressed.Stress increases muscle tension and can worsen back pain.It is important to recognize when you are anxious or stressed and learn ways to manage it.Exercise is a great option. SEEK MEDICAL CARE IF:  You have pain that is not  relieved with rest or medicine.  You have pain that does not improve in 1 week.  You have new symptoms.  You are generally not feeling well. SEEK IMMEDIATE MEDICAL CARE IF:   You have pain that radiates from your back into your legs.  You develop new bowel or bladder control problems.  You have unusual weakness or numbness in your arms or legs.  You develop nausea or vomiting.  You develop abdominal pain.  You feel faint. Document Released: 01/20/2005 Document Revised: 07/22/2011 Document Reviewed: 06/10/2010 [facility] Patient Information 2015 [place], [place]. This information is not intended to replace advice given to you by your health care provider. Make sure you discuss any questions you have with your health care provider.

## 2013-08-11 NOTE — ED Notes (Addendum)
Pt refusing IV and asking if medication can be given PO.  PA notified.

## 2013-08-15 NOTE — ED Provider Notes (Signed)
Medical screening examination/treatment/procedure(s) were performed by non-physician practitioner and as supervising physician I was immediately available for consultation/collaboration.   EKG Interpretation None        Lyanne CoKevin M Athan Casalino, MD 08/15/13 1530

## 2013-11-18 ENCOUNTER — Other Ambulatory Visit: Payer: Self-pay

## 2014-12-23 ENCOUNTER — Emergency Department (HOSPITAL_COMMUNITY)
Admission: EM | Admit: 2014-12-23 | Discharge: 2014-12-23 | Discharge status: Absent without leave | Payer: Self-pay | Source: Home / Self Care

## 2014-12-23 ENCOUNTER — Ambulatory Visit (HOSPITAL_COMMUNITY): Admission: RE | Admit: 2014-12-23 | Payer: MEDICAID | Source: Ambulatory Visit

## 2014-12-23 ENCOUNTER — Emergency Department (HOSPITAL_COMMUNITY): Payer: Self-pay

## 2014-12-23 ENCOUNTER — Observation Stay (HOSPITAL_COMMUNITY)
Admission: EM | Admit: 2014-12-23 | Discharge: 2014-12-24 | Disposition: A | Discharge status: Home / Self Care | Payer: Self-pay | Attending: General Surgery | Admitting: General Surgery

## 2014-12-23 ENCOUNTER — Encounter (HOSPITAL_COMMUNITY): Payer: Self-pay

## 2014-12-23 DIAGNOSIS — S31117A Laceration without foreign body of abdominal wall, left flank without penetration into peritoneal cavity, initial encounter: Secondary | ICD-10-CM | POA: Diagnosis present

## 2014-12-23 DIAGNOSIS — S31119A Laceration without foreign body of abdominal wall, unspecified quadrant without penetration into peritoneal cavity, initial encounter: Secondary | ICD-10-CM | POA: Diagnosis present

## 2014-12-23 DIAGNOSIS — S41011A Laceration without foreign body of right shoulder, initial encounter: Secondary | ICD-10-CM | POA: Insufficient documentation

## 2014-12-23 DIAGNOSIS — S31114A Laceration without foreign body of abdominal wall, left lower quadrant without penetration into peritoneal cavity, initial encounter: Principal | ICD-10-CM | POA: Insufficient documentation

## 2014-12-23 HISTORY — DX: Essential (primary) hypertension: I10

## 2014-12-23 LAB — CDS SEROLOGY

## 2014-12-23 MED ORDER — MORPHINE SULFATE (PF) 4 MG/ML IV SOLN
4.0000 mg | Freq: Once | INTRAVENOUS | Status: AC
  Administered 2014-12-23: 4 mg via INTRAVENOUS
  Filled 2014-12-23: qty 1

## 2014-12-23 MED ORDER — CEFAZOLIN SODIUM-DEXTROSE 2-3 GM-% IV SOLR
2.0000 g | Freq: Once | INTRAVENOUS | Status: AC
  Administered 2014-12-23: 2 g via INTRAVENOUS
  Filled 2014-12-23: qty 50

## 2014-12-23 MED ORDER — FENTANYL CITRATE (PF) 100 MCG/2ML IJ SOLN
INTRAMUSCULAR | Status: AC | PRN
  Administered 2014-12-23 (×2): 100 ug via INTRAVENOUS

## 2014-12-23 MED ORDER — SODIUM CHLORIDE 0.9 % IV SOLN
INTRAVENOUS | Status: DC
  Administered 2014-12-23: 16:00:00 via INTRAVENOUS

## 2014-12-23 MED ORDER — ONDANSETRON HCL 4 MG/2ML IJ SOLN: 4.0000 mg | Freq: Four times a day (QID) | INTRAMUSCULAR | Status: DC | PRN

## 2014-12-23 MED ORDER — FENTANYL CITRATE (PF) 100 MCG/2ML IJ SOLN
INTRAMUSCULAR | Status: AC
  Filled 2014-12-23: qty 2

## 2014-12-23 MED ORDER — DOCUSATE SODIUM 100 MG PO CAPS
100.0000 mg | ORAL_CAPSULE | Freq: Two times a day (BID) | ORAL | Status: DC
  Administered 2014-12-23 – 2014-12-24 (×4): 100 mg via ORAL
  Filled 2014-12-23 (×3): qty 1

## 2014-12-23 MED ORDER — SODIUM CHLORIDE 0.9 % IV SOLN
INTRAVENOUS | Status: DC | PRN
  Administered 2014-12-23: 1000 mL via INTRAVENOUS

## 2014-12-23 MED ORDER — CEFAZOLIN SODIUM-DEXTROSE 2-3 GM-% IV SOLR: 2.0000 g | Freq: Once | INTRAVENOUS | Status: DC

## 2014-12-23 MED ORDER — HYDROMORPHONE HCL 1 MG/ML IJ SOLN
1.0000 mg | INTRAMUSCULAR | Status: DC | PRN
  Administered 2014-12-23: 2 mg via INTRAVENOUS
  Administered 2014-12-23: 1 mg via INTRAVENOUS
  Administered 2014-12-24 (×2): 2 mg via INTRAVENOUS
  Filled 2014-12-23 (×2): qty 2
  Filled 2014-12-23 (×2): qty 1
  Filled 2014-12-23: qty 2

## 2014-12-23 MED ORDER — LIDOCAINE-EPINEPHRINE (PF) 2 %-1:200000 IJ SOLN
10.0000 mL | Freq: Once | INTRAMUSCULAR | Status: AC
  Administered 2014-12-23: 10 mL via INTRADERMAL
  Filled 2014-12-23: qty 20

## 2014-12-23 MED ORDER — IOHEXOL 300 MG/ML  SOLN
100.0000 mL | Freq: Once | INTRAMUSCULAR | Status: AC | PRN
  Administered 2014-12-23: 100 mL via INTRAVENOUS

## 2014-12-23 MED ORDER — CEFAZOLIN SODIUM 1-5 GM-% IV SOLN
1.0000 g | Freq: Three times a day (TID) | INTRAVENOUS | Status: AC
  Administered 2014-12-23 – 2014-12-24 (×2): 1 g via INTRAVENOUS
  Filled 2014-12-23 (×3): qty 50

## 2014-12-23 MED ORDER — ONDANSETRON HCL 4 MG PO TABS: 4.0000 mg | ORAL_TABLET | Freq: Four times a day (QID) | ORAL | Status: DC | PRN

## 2014-12-23 MED ORDER — TETANUS-DIPHTH-ACELL PERTUSSIS 5-2.5-18.5 LF-MCG/0.5 IM SUSP
INTRAMUSCULAR | Status: AC
  Filled 2014-12-23: qty 0.5

## 2014-12-23 MED ORDER — TETANUS-DIPHTH-ACELL PERTUSSIS 5-2.5-18.5 LF-MCG/0.5 IM SUSP
0.5000 mL | Freq: Once | INTRAMUSCULAR | Status: AC
  Administered 2014-12-23: 0.5 mL via INTRAMUSCULAR
  Filled 2014-12-23: qty 0.5

## 2014-12-23 MED ORDER — OXYCODONE HCL 5 MG PO TABS
10.0000 mg | ORAL_TABLET | ORAL | Status: DC | PRN
  Administered 2014-12-23 – 2014-12-24 (×6): 10 mg via ORAL
  Filled 2014-12-23 (×6): qty 2
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
## 2014-12-23 NOTE — ED Notes (Signed)
Pt transported to CT with RN
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
## 2014-12-23 NOTE — ED Notes (Addendum)
Pt transported to CT ?
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
## 2014-12-24 ENCOUNTER — Observation Stay (HOSPITAL_COMMUNITY): Payer: Self-pay

## 2014-12-24 LAB — SAMPLE TO BLOOD BANK

## 2014-12-24 LAB — PREPARE FRESH FROZEN PLASMA
Unit division: 0
Unit division: 0

## 2014-12-24 LAB — CBC
HCT: 44 %
HCT: 39.3 %
HEMOGLOBIN: 14.2 g/dL
HEMOGLOBIN: 12.6 g/dL
MCH: 27.3 pg
MCH: 27.2 pg
MCHC: 32.3 g/dL
MCHC: 32.1 g/dL
MCV: 84.6 fL
MCV: 84.9 fL
PLATELETS: 259 10*3/uL
Platelets: 281 10*3/uL
RBC: 5.2 MIL/uL
RBC: 4.63 MIL/uL
RDW: 14.9 %
RDW: 15.3 %
WBC: 7.2 10*3/uL
WBC: 6.3 10*3/uL

## 2014-12-24 LAB — COMPREHENSIVE METABOLIC PANEL
ALBUMIN: 4.2 g/dL
ALT: 22 U/L
ANION GAP: 15
AST: 30 U/L
Alkaline Phosphatase: 75 U/L
BUN: 9 mg/dL
CHLORIDE: 108 mmol/L
CO2: 21 mmol/L
CREATININE: 1.63 mg/dL
Calcium: 9.1 mg/dL
Glucose, Bld: 86 mg/dL
Potassium: 3.9 mmol/L
SODIUM: 144 mmol/L
Total Bilirubin: 0.6 mg/dL
Total Protein: 7 g/dL

## 2014-12-24 LAB — TYPE AND SCREEN
UNIT DIVISION: 0
Unit division: 0

## 2014-12-24 LAB — ETHANOL: Alcohol, Ethyl (B): 224 mg/dL

## 2014-12-24 LAB — PROTIME-INR
INR: 0.99
Prothrombin Time: 13.3 seconds

## 2014-12-24 LAB — BLOOD PRODUCT ORDER (VERBAL) VERIFICATION

## 2014-12-24 MED ORDER — OXYCODONE HCL 10 MG PO TABS: 10.0000 mg | ORAL_TABLET | ORAL | Status: DC | PRN

## 2014-12-24 NOTE — Discharge Instructions (Signed)

## 2014-12-24 NOTE — Progress Notes (Signed)
Notified Dr. Dwain SarnaWakefield of DC Rx not signed nor in correct name. He said for pt to take Tylenol and Ibuprofen as needed.
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
## 2014-12-26 ENCOUNTER — Encounter (HOSPITAL_COMMUNITY): Payer: Self-pay | Admitting: Emergency Medicine

## 2014-12-26 ENCOUNTER — Telehealth (HOSPITAL_COMMUNITY): Payer: Self-pay | Admitting: Orthopedic Surgery
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
## 2014-12-28 ENCOUNTER — Emergency Department (HOSPITAL_COMMUNITY)
Admission: EM | Admit: 2014-12-28 | Discharge: 2014-12-28 | Disposition: A | Discharge status: Home / Self Care | Payer: Self-pay | Attending: Emergency Medicine | Admitting: Emergency Medicine

## 2014-12-28 ENCOUNTER — Encounter (HOSPITAL_COMMUNITY): Payer: Self-pay | Admitting: Cardiology

## 2014-12-28 DIAGNOSIS — S21202S Unspecified open wound of left back wall of thorax without penetration into thoracic cavity, sequela: Secondary | ICD-10-CM | POA: Insufficient documentation

## 2014-12-28 DIAGNOSIS — X58XXXS Exposure to other specified factors, sequela: Secondary | ICD-10-CM | POA: Insufficient documentation

## 2014-12-28 DIAGNOSIS — I10 Essential (primary) hypertension: Secondary | ICD-10-CM | POA: Insufficient documentation

## 2014-12-28 DIAGNOSIS — Z79899 Other long term (current) drug therapy: Secondary | ICD-10-CM | POA: Insufficient documentation

## 2014-12-28 DIAGNOSIS — Z792 Long term (current) use of antibiotics: Secondary | ICD-10-CM | POA: Insufficient documentation

## 2014-12-28 DIAGNOSIS — Z880 Allergy status to penicillin: Secondary | ICD-10-CM | POA: Insufficient documentation

## 2014-12-28 DIAGNOSIS — Z791 Long term (current) use of non-steroidal anti-inflammatories (NSAID): Secondary | ICD-10-CM | POA: Insufficient documentation

## 2014-12-28 DIAGNOSIS — S21212S Laceration without foreign body of left back wall of thorax without penetration into thoracic cavity, sequela: Secondary | ICD-10-CM

## 2014-12-28 MED ORDER — DOCUSATE SODIUM 100 MG PO CAPS: 100.0000 mg | ORAL_CAPSULE | Freq: Two times a day (BID) | ORAL | Status: DC
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
## 2015-01-08 ENCOUNTER — Telehealth (HOSPITAL_COMMUNITY): Payer: Self-pay | Admitting: Orthopedic Surgery

## 2015-01-08 NOTE — Telephone Encounter (Signed)
Requesting staple removal.  He asks "if they fall out on their own".  Made him a follow up appointment for staple removal on 01/17/15 at 2:45pm.  He couldn't come in this Wednesday due to going out of town.  I also informed him that he could get his staples out at his PCP or urgent care if not able to come to our office.    Of note he has been to the ER to request pain medications, but he gets 90 tabs of narcotics every month from a provider currently as seen in the narcotic database.

## 2015-01-23 NOTE — Telephone Encounter (Signed)
Scheduled appt for 12/21.

## 2015-11-25 ENCOUNTER — Encounter (HOSPITAL_COMMUNITY): Payer: Self-pay | Admitting: Emergency Medicine

## 2015-11-25 ENCOUNTER — Emergency Department (HOSPITAL_COMMUNITY)
Admission: EM | Admit: 2015-11-25 | Discharge: 2015-11-25 | Disposition: A | Discharge status: Home / Self Care | Payer: Self-pay | Attending: Emergency Medicine | Admitting: Emergency Medicine

## 2015-11-25 DIAGNOSIS — G8929 Other chronic pain: Secondary | ICD-10-CM

## 2015-11-25 DIAGNOSIS — R319 Hematuria, unspecified: Secondary | ICD-10-CM | POA: Insufficient documentation

## 2015-11-25 DIAGNOSIS — N200 Calculus of kidney: Secondary | ICD-10-CM | POA: Insufficient documentation

## 2015-11-25 DIAGNOSIS — I10 Essential (primary) hypertension: Secondary | ICD-10-CM | POA: Insufficient documentation

## 2015-11-25 DIAGNOSIS — M545 Low back pain: Secondary | ICD-10-CM

## 2015-11-25 LAB — URINALYSIS, ROUTINE W REFLEX MICROSCOPIC
Bilirubin Urine: NEGATIVE
GLUCOSE, UA: NEGATIVE mg/dL
Hgb urine dipstick: NEGATIVE
Ketones, ur: NEGATIVE mg/dL
LEUKOCYTES UA: NEGATIVE
Nitrite: NEGATIVE
PROTEIN: NEGATIVE mg/dL
SPECIFIC GRAVITY, URINE: 1.016 (ref 1.005–1.030)
pH: 6.5 (ref 5.0–8.0)

## 2015-11-25 MED ORDER — KETOROLAC TROMETHAMINE 30 MG/ML IJ SOLN
15.0000 mg | Freq: Once | INTRAMUSCULAR | Status: AC
  Administered 2015-11-25: 15 mg via INTRAMUSCULAR
  Filled 2015-11-25: qty 1

## 2015-11-25 MED ORDER — NAPROXEN 500 MG PO TABS: 500.0000 mg | ORAL_TABLET | Freq: Two times a day (BID) | ORAL | 0 refills | Status: DC | PRN
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
## 2015-11-25 NOTE — ED Triage Notes (Signed)
Pt. Stated, I passed a kidney stone and my lower back is hurting

## 2015-11-25 NOTE — Discharge Instructions (Signed)
Back Pain: Your back pain should be treated with medicines such as ibuprofen or aleve and this back pain should get better over the next 2 weeks.  However if you develop severe or worsening pain, low back pain with fever, numbness, weakness or inability to walk or urinate, you should return to the ER immediately.  Please follow up with your doctor this week for a recheck if still having symptoms.  Avoid heavy lifting over 10 pounds over the next two weeks.  Low back pain is discomfort in the lower back that may be due to injuries to muscles and ligaments around the spine.  Occasionally, it may be caused by a a problem to a part of the spine called a disc.  The pain may last several days or a week;  However, most patients get completely well in 4 weeks.  Self - care:  The application of heat can help soothe the pain.  Maintaining your daily activities, including walking, is encourged, as it will help you get better faster than just staying in bed. Perform gentle stretching as discussed. Drink plenty of fluids.  Medications are also useful to help with pain control.  A commonly prescribed medication includes tylenol. Take as directed on the over-the-counter bottle.  Non steroidal anti inflammatory medications including Ibuprofen and naproxen;  These medications help both pain and swelling and are very useful in treating back pain.  They should be taken with food, as they can cause stomach upset, and more seriously, stomach bleeding.     SEEK IMMEDIATE MEDICAL ATTENTION IF: New numbness, tingling, weakness, or problem with the use of your arms or legs.  Severe back pain not relieved with medications.  Difficulty with or loss of control of your bowel or bladder control.  Increasing pain in any areas of the body (such as chest or abdominal pain).  Shortness of breath, dizziness or fainting.  Nausea (feeling sick to your stomach), vomiting, fever, or sweats.  You will need to follow up with  Your  primary healthcare provider in 1-2 weeks for reassessment.

## 2015-11-26 LAB — URINE CULTURE: CULTURE: NO GROWTH

## 2015-11-26 LAB — GC/CHLAMYDIA PROBE AMP (~~LOC~~) NOT AT ARMC
Chlamydia: NEGATIVE
NEISSERIA GONORRHEA: NEGATIVE

## 2017-01-03 ENCOUNTER — Emergency Department (HOSPITAL_COMMUNITY)
Admission: EM | Admit: 2017-01-03 | Discharge: 2017-01-04 | Disposition: A | Discharge status: Home / Self Care | Payer: Self-pay | Attending: Emergency Medicine | Admitting: Emergency Medicine

## 2017-01-03 ENCOUNTER — Encounter (HOSPITAL_COMMUNITY): Payer: Self-pay | Admitting: *Deleted

## 2017-01-03 DIAGNOSIS — G8929 Other chronic pain: Secondary | ICD-10-CM | POA: Insufficient documentation

## 2017-01-03 DIAGNOSIS — E876 Hypokalemia: Secondary | ICD-10-CM | POA: Insufficient documentation

## 2017-01-03 DIAGNOSIS — M62830 Muscle spasm of back: Secondary | ICD-10-CM | POA: Insufficient documentation

## 2017-01-03 DIAGNOSIS — Z79899 Other long term (current) drug therapy: Secondary | ICD-10-CM | POA: Insufficient documentation

## 2017-01-03 DIAGNOSIS — I10 Essential (primary) hypertension: Secondary | ICD-10-CM | POA: Insufficient documentation

## 2017-01-03 DIAGNOSIS — Z791 Long term (current) use of non-steroidal anti-inflammatories (NSAID): Secondary | ICD-10-CM | POA: Insufficient documentation

## 2017-01-03 LAB — URINALYSIS, ROUTINE W REFLEX MICROSCOPIC
BILIRUBIN URINE: NEGATIVE
Glucose, UA: NEGATIVE mg/dL
Hgb urine dipstick: NEGATIVE
Ketones, ur: NEGATIVE mg/dL
Leukocytes, UA: NEGATIVE
NITRITE: NEGATIVE
Protein, ur: NEGATIVE mg/dL
SPECIFIC GRAVITY, URINE: 1.001 — AB (ref 1.005–1.030)
pH: 6 (ref 5.0–8.0)

## 2017-01-03 LAB — CBC
HCT: 43 % (ref 39.0–52.0)
Hemoglobin: 14.2 g/dL (ref 13.0–17.0)
MCH: 26.8 pg (ref 26.0–34.0)
MCHC: 33 g/dL (ref 30.0–36.0)
MCV: 81.1 fL (ref 78.0–100.0)
Platelets: 317 10*3/uL (ref 150–400)
RBC: 5.3 MIL/uL (ref 4.22–5.81)
RDW: 14.1 % (ref 11.5–15.5)
WBC: 5.6 10*3/uL (ref 4.0–10.5)

## 2017-01-03 NOTE — ED Triage Notes (Signed)
The pt has multiple complaints  He reports that he is sob bi-lateral leg pain and he reports also that his legs gave way on him x 2 the past 2 days he lost the use of his bowels x 2 frequent headaches  For 2 days he also feels jittery diarrhea
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
## 2017-01-04 ENCOUNTER — Other Ambulatory Visit: Payer: Self-pay

## 2017-01-04 ENCOUNTER — Emergency Department (HOSPITAL_COMMUNITY): Payer: Self-pay

## 2017-01-04 DIAGNOSIS — G8929 Other chronic pain: Secondary | ICD-10-CM | POA: Insufficient documentation

## 2017-01-04 DIAGNOSIS — Z79899 Other long term (current) drug therapy: Secondary | ICD-10-CM | POA: Insufficient documentation

## 2017-01-04 DIAGNOSIS — M5440 Lumbago with sciatica, unspecified side: Secondary | ICD-10-CM | POA: Insufficient documentation

## 2017-01-04 DIAGNOSIS — I10 Essential (primary) hypertension: Secondary | ICD-10-CM | POA: Insufficient documentation

## 2017-01-04 LAB — COMPREHENSIVE METABOLIC PANEL
ALT: 12 U/L — ABNORMAL LOW (ref 17–63)
AST: 15 U/L (ref 15–41)
Albumin: 4.3 g/dL (ref 3.5–5.0)
Alkaline Phosphatase: 74 U/L (ref 38–126)
Anion gap: 8 (ref 5–15)
BILIRUBIN TOTAL: 0.6 mg/dL (ref 0.3–1.2)
BUN: 9 mg/dL (ref 6–20)
CHLORIDE: 103 mmol/L (ref 101–111)
CO2: 25 mmol/L (ref 22–32)
Calcium: 9 mg/dL (ref 8.9–10.3)
Creatinine, Ser: 1.28 mg/dL — ABNORMAL HIGH (ref 0.61–1.24)
GFR calc Af Amer: >60 mL/min (ref >60)
Glucose, Bld: 105 mg/dL — ABNORMAL HIGH (ref 65–99)
POTASSIUM: 3.2 mmol/L — AB (ref 3.5–5.1)
Sodium: 136 mmol/L (ref 135–145)
Total Protein: 6.9 g/dL (ref 6.5–8.1)

## 2017-01-04 LAB — LIPASE, BLOOD: LIPASE: 20 U/L (ref 11–51)

## 2017-01-04 MED ORDER — KETOROLAC TROMETHAMINE 15 MG/ML IJ SOLN
15.0000 mg | Freq: Once | INTRAMUSCULAR | Status: AC
  Administered 2017-01-04: 15 mg via INTRAVENOUS
  Filled 2017-01-04: qty 1

## 2017-01-04 MED ORDER — METOCLOPRAMIDE HCL 5 MG/ML IJ SOLN
10.0000 mg | Freq: Once | INTRAMUSCULAR | Status: AC
  Administered 2017-01-04: 10 mg via INTRAVENOUS
  Filled 2017-01-04: qty 2

## 2017-01-04 MED ORDER — POTASSIUM CHLORIDE CRYS ER 20 MEQ PO TBCR
40.0000 meq | EXTENDED_RELEASE_TABLET | Freq: Once | ORAL | Status: AC
  Administered 2017-01-04: 40 meq via ORAL
  Filled 2017-01-04: qty 2

## 2017-01-04 MED ORDER — METHOCARBAMOL 500 MG PO TABS: 500.0000 mg | ORAL_TABLET | Freq: Three times a day (TID) | ORAL | 0 refills | Status: DC | PRN

## 2017-01-04 MED ORDER — POTASSIUM CHLORIDE CRYS ER 20 MEQ PO TBCR: 40.0000 meq | EXTENDED_RELEASE_TABLET | Freq: Every day | ORAL | 0 refills | Status: DC

## 2017-01-04 MED ORDER — HYDROMORPHONE HCL 1 MG/ML IJ SOLN
1.0000 mg | Freq: Once | INTRAMUSCULAR | Status: AC
  Administered 2017-01-04: 1 mg via INTRAVENOUS
  Filled 2017-01-04: qty 1

## 2017-01-04 MED ORDER — TRAMADOL HCL 50 MG PO TABS: 50.0000 mg | ORAL_TABLET | Freq: Two times a day (BID) | ORAL | 0 refills | Status: DC | PRN

## 2017-01-04 MED ORDER — DIPHENHYDRAMINE HCL 50 MG/ML IJ SOLN
25.0000 mg | Freq: Once | INTRAMUSCULAR | Status: AC
  Administered 2017-01-04: 25 mg via INTRAVENOUS
  Filled 2017-01-04: qty 1

## 2017-01-04 MED ORDER — GADOBENATE DIMEGLUMINE 529 MG/ML IV SOLN
19.0000 mL | Freq: Once | INTRAVENOUS | Status: AC | PRN
  Administered 2017-01-04: 19 mL via INTRAVENOUS

## 2017-01-04 MED ORDER — DEXAMETHASONE SODIUM PHOSPHATE 10 MG/ML IJ SOLN
10.0000 mg | Freq: Once | INTRAMUSCULAR | Status: AC
  Administered 2017-01-04: 10 mg via INTRAVENOUS
  Filled 2017-01-04: qty 1

## 2017-01-04 NOTE — ED Triage Notes (Signed)
Pt to ER for evaluation of mid/thoracic/lower lumbar back pain that patient was seen here for yesterday and had negative MRI. Pt states has had involuntarily lost control of his bowels again today. NAD at this time. Ambulatory without difficulty.

## 2017-01-04 NOTE — ED Notes (Signed)
Patient transported to MRI 

## 2017-01-05 ENCOUNTER — Emergency Department (HOSPITAL_COMMUNITY)
Admission: EM | Admit: 2017-01-05 | Discharge: 2017-01-05 | Disposition: A | Discharge status: Home / Self Care | Payer: Self-pay | Attending: Emergency Medicine | Admitting: Emergency Medicine

## 2017-01-05 DIAGNOSIS — G8929 Other chronic pain: Secondary | ICD-10-CM

## 2017-01-05 DIAGNOSIS — M5440 Lumbago with sciatica, unspecified side: Secondary | ICD-10-CM

## 2017-01-05 MED ORDER — KETOROLAC TROMETHAMINE 30 MG/ML IJ SOLN
30.0000 mg | Freq: Once | INTRAMUSCULAR | Status: AC
  Administered 2017-01-05: 30 mg via INTRAMUSCULAR
  Filled 2017-01-05: qty 1

## 2017-01-05 MED ORDER — OXYCODONE-ACETAMINOPHEN 5-325 MG PO TABS
1.0000 | ORAL_TABLET | Freq: Once | ORAL | Status: AC
  Administered 2017-01-05: 1 via ORAL
  Filled 2017-01-05: qty 1

## 2017-01-05 NOTE — Discharge Instructions (Signed)
Seen today for back pain and weakness.  Your workup during her last visit was reassuring including a full MRI.  Follow-up with your primary physician for ongoing pain management needs.  You will be given follow-up information for neurology if he has ongoing weakness.  If you develop weakness that is persistent and does not improve or any new or worsening symptoms you should be reevaluated.
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
## 2018-03-17 ENCOUNTER — Emergency Department (HOSPITAL_COMMUNITY): Payer: BLUE CROSS/BLUE SHIELD

## 2018-03-17 ENCOUNTER — Encounter (HOSPITAL_COMMUNITY): Payer: Self-pay | Admitting: Emergency Medicine

## 2018-03-17 ENCOUNTER — Emergency Department (HOSPITAL_COMMUNITY)
Admission: EM | Admit: 2018-03-17 | Discharge: 2018-03-17 | Disposition: A | Discharge status: Home / Self Care | Payer: BLUE CROSS/BLUE SHIELD | Attending: Emergency Medicine | Admitting: Emergency Medicine

## 2018-03-17 DIAGNOSIS — Y9389 Activity, other specified: Secondary | ICD-10-CM | POA: Insufficient documentation

## 2018-03-17 DIAGNOSIS — M542 Cervicalgia: Secondary | ICD-10-CM | POA: Insufficient documentation

## 2018-03-17 DIAGNOSIS — Y92410 Unspecified street and highway as the place of occurrence of the external cause: Secondary | ICD-10-CM | POA: Insufficient documentation

## 2018-03-17 DIAGNOSIS — Y999 Unspecified external cause status: Secondary | ICD-10-CM | POA: Insufficient documentation

## 2018-03-17 DIAGNOSIS — I10 Essential (primary) hypertension: Secondary | ICD-10-CM | POA: Insufficient documentation

## 2018-03-17 DIAGNOSIS — M545 Low back pain: Secondary | ICD-10-CM | POA: Insufficient documentation

## 2018-03-17 DIAGNOSIS — Z79899 Other long term (current) drug therapy: Secondary | ICD-10-CM | POA: Diagnosis not present

## 2018-03-17 DIAGNOSIS — M549 Dorsalgia, unspecified: Secondary | ICD-10-CM | POA: Diagnosis not present

## 2018-03-17 MED ORDER — OXYCODONE-ACETAMINOPHEN 5-325 MG PO TABS
1.0000 | ORAL_TABLET | Freq: Once | ORAL | Status: AC
  Administered 2018-03-17: 1 via ORAL
  Filled 2018-03-17: qty 1

## 2018-03-17 MED ORDER — CYCLOBENZAPRINE HCL 10 MG PO TABS: 10.0000 mg | ORAL_TABLET | Freq: Two times a day (BID) | ORAL | 0 refills | Status: DC | PRN

## 2018-03-17 MED ORDER — NAPROXEN 500 MG PO TABS: 500.0000 mg | ORAL_TABLET | Freq: Two times a day (BID) | ORAL | 0 refills | Status: DC | PRN
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
## 2018-03-17 NOTE — ED Notes (Signed)
Patient was sleeping in lobby woke patient to bring them back to a room. Ambulates without difficulty. C/o lower back pain
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
## 2018-08-22 ENCOUNTER — Other Ambulatory Visit: Payer: Self-pay

## 2018-08-22 ENCOUNTER — Emergency Department (HOSPITAL_COMMUNITY)
Admission: EM | Admit: 2018-08-22 | Discharge: 2018-08-22 | Disposition: A | Discharge status: Home / Self Care | Payer: BC Managed Care – PPO | Attending: Emergency Medicine | Admitting: Emergency Medicine

## 2018-08-22 ENCOUNTER — Encounter (HOSPITAL_COMMUNITY): Payer: Self-pay

## 2018-08-22 DIAGNOSIS — Z881 Allergy status to other antibiotic agents status: Secondary | ICD-10-CM | POA: Diagnosis not present

## 2018-08-22 DIAGNOSIS — R197 Diarrhea, unspecified: Secondary | ICD-10-CM | POA: Insufficient documentation

## 2018-08-22 DIAGNOSIS — Z880 Allergy status to penicillin: Secondary | ICD-10-CM | POA: Insufficient documentation

## 2018-08-22 DIAGNOSIS — I10 Essential (primary) hypertension: Secondary | ICD-10-CM | POA: Diagnosis not present

## 2018-08-22 DIAGNOSIS — R109 Unspecified abdominal pain: Secondary | ICD-10-CM | POA: Diagnosis not present

## 2018-08-22 DIAGNOSIS — Z9102 Food additives allergy status: Secondary | ICD-10-CM | POA: Insufficient documentation

## 2018-08-22 DIAGNOSIS — R112 Nausea with vomiting, unspecified: Secondary | ICD-10-CM | POA: Diagnosis not present

## 2018-08-22 DIAGNOSIS — Z885 Allergy status to narcotic agent status: Secondary | ICD-10-CM | POA: Insufficient documentation

## 2018-08-22 LAB — URINALYSIS, ROUTINE W REFLEX MICROSCOPIC
Bilirubin Urine: NEGATIVE
Glucose, UA: NEGATIVE mg/dL
Hgb urine dipstick: NEGATIVE
Ketones, ur: NEGATIVE mg/dL
Leukocytes,Ua: NEGATIVE
Nitrite: NEGATIVE
Protein, ur: NEGATIVE mg/dL
Specific Gravity, Urine: 1 — ABNORMAL LOW (ref 1.005–1.030)
pH: 6 (ref 5.0–8.0)

## 2018-08-22 LAB — CBC WITH DIFFERENTIAL/PLATELET
Abs Immature Granulocytes: 0.04 10*3/uL (ref 0.00–0.07)
Basophils Absolute: 0 10*3/uL (ref 0.0–0.1)
Basophils Relative: 1 %
Eosinophils Absolute: 0 10*3/uL (ref 0.0–0.5)
Eosinophils Relative: 0 %
HCT: 46.6 % (ref 39.0–52.0)
Hemoglobin: 14.4 g/dL (ref 13.0–17.0)
Immature Granulocytes: 1 %
Lymphocytes Relative: 13 %
Lymphs Abs: 0.7 10*3/uL (ref 0.7–4.0)
MCH: 26.2 pg (ref 26.0–34.0)
MCHC: 30.9 g/dL (ref 30.0–36.0)
MCV: 84.9 fL (ref 80.0–100.0)
Monocytes Absolute: 0.1 10*3/uL (ref 0.1–1.0)
Monocytes Relative: 2 %
Neutro Abs: 4.9 10*3/uL (ref 1.7–7.7)
Neutrophils Relative %: 83 %
Platelets: 287 10*3/uL (ref 150–400)
RBC: 5.49 MIL/uL (ref 4.22–5.81)
RDW: 14.2 % (ref 11.5–15.5)
WBC: 5.8 10*3/uL (ref 4.0–10.5)
nRBC: 0 % (ref 0.0–0.2)

## 2018-08-22 LAB — COMPREHENSIVE METABOLIC PANEL
ALT: 14 U/L (ref 0–44)
AST: 15 U/L (ref 15–41)
Albumin: 4.2 g/dL (ref 3.5–5.0)
Alkaline Phosphatase: 66 U/L (ref 38–126)
Anion gap: 13 (ref 5–15)
BUN: 12 mg/dL (ref 6–20)
CO2: 19 mmol/L — ABNORMAL LOW (ref 22–32)
Calcium: 9.3 mg/dL (ref 8.9–10.3)
Chloride: 106 mmol/L (ref 98–111)
Creatinine, Ser: 1.05 mg/dL (ref 0.61–1.24)
GFR calc Af Amer: >60 mL/min (ref >60)
GFR calc non Af Amer: >60 mL/min (ref >60)
Glucose, Bld: 127 mg/dL — ABNORMAL HIGH (ref 70–99)
Potassium: 3.9 mmol/L (ref 3.5–5.1)
Sodium: 138 mmol/L (ref 135–145)
Total Bilirubin: 0.6 mg/dL (ref 0.3–1.2)
Total Protein: 7.7 g/dL (ref 6.5–8.1)

## 2018-08-22 LAB — LIPASE, BLOOD: Lipase: 24 U/L (ref 11–51)

## 2018-08-22 LAB — RAPID URINE DRUG SCREEN, HOSP PERFORMED
Amphetamines: NOT DETECTED
Barbiturates: NOT DETECTED
Benzodiazepines: NOT DETECTED
Cocaine: NOT DETECTED
Opiates: NOT DETECTED
Tetrahydrocannabinol: NOT DETECTED

## 2018-08-22 MED ORDER — ONDANSETRON 8 MG PO TBDP
8.0000 mg | ORAL_TABLET | Freq: Once | ORAL | Status: AC
  Administered 2018-08-22: 15:00:00 8 mg via ORAL

## 2018-08-22 MED ORDER — KETOROLAC TROMETHAMINE 30 MG/ML IJ SOLN
30.0000 mg | Freq: Once | INTRAMUSCULAR | Status: DC
  Filled 2018-08-22: qty 1

## 2018-08-22 MED ORDER — ONDANSETRON 8 MG PO TBDP: ORAL_TABLET | ORAL | 0 refills | Status: AC

## 2018-08-22 MED ORDER — SODIUM CHLORIDE 0.9 % IV BOLUS: 1000.0000 mL | Freq: Once | INTRAVENOUS | Status: DC

## 2018-08-22 MED ORDER — KETOROLAC TROMETHAMINE 60 MG/2ML IM SOLN
60.0000 mg | Freq: Once | INTRAMUSCULAR | Status: AC
  Administered 2018-08-22: 15:00:00 60 mg via INTRAMUSCULAR
  Filled 2018-08-22: qty 2

## 2018-08-22 MED ORDER — ONDANSETRON 8 MG PO TBDP
8.0000 mg | ORAL_TABLET | Freq: Once | ORAL | Status: DC
  Filled 2018-08-22: qty 1

## 2018-08-22 NOTE — ED Notes (Signed)
States he feels horrible as I woke him from sleep. "My back and chest is hurting really bad" Again pt is comfortable appearing and sleeping at intervals, When asked if anything else is wrong states "I have been throwing up all morning" Last time he vomited was PTA, pt comes out to desk asking to go to bathroom.

## 2018-08-22 NOTE — ED Triage Notes (Signed)
He c/o n/v x 2 days. He asks for food upon arrival. We explain that he may not have food/drink until seen by our provider. He is in no distress.

## 2018-08-22 NOTE — ED Notes (Signed)
PO challenge given  

## 2018-08-22 NOTE — Discharge Instructions (Addendum)
Zofran as prescribed as needed for nausea.  Clear liquid diet for the next 12 hours, then advance to normal as tolerated.  Return to the emergency department for severe abdominal pain, bloody stool or vomit, high fever, or other new and concerning symptoms.
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
## 2018-08-22 NOTE — ED Notes (Signed)
When asked why pt chose to put soap and water in container, he just stared and would not answer.

## 2018-12-09 ENCOUNTER — Other Ambulatory Visit: Payer: Self-pay

## 2018-12-09 DIAGNOSIS — Z20822 Contact with and (suspected) exposure to covid-19: Secondary | ICD-10-CM

## 2018-12-10 LAB — NOVEL CORONAVIRUS, NAA: SARS-CoV-2, NAA: NOT DETECTED

## 2019-05-30 ENCOUNTER — Encounter (HOSPITAL_COMMUNITY): Payer: Self-pay

## 2019-05-30 ENCOUNTER — Emergency Department (HOSPITAL_COMMUNITY)
Admission: EM | Admit: 2019-05-30 | Discharge: 2019-05-31 | Disposition: A | Discharge status: Home / Self Care | Payer: BC Managed Care – PPO | Attending: Emergency Medicine | Admitting: Emergency Medicine

## 2019-05-30 ENCOUNTER — Other Ambulatory Visit: Payer: Self-pay

## 2019-05-30 DIAGNOSIS — T507X1A Poisoning by analeptics and opioid receptor antagonists, accidental (unintentional), initial encounter: Secondary | ICD-10-CM | POA: Diagnosis not present

## 2019-05-30 DIAGNOSIS — I10 Essential (primary) hypertension: Secondary | ICD-10-CM | POA: Diagnosis not present

## 2019-05-30 DIAGNOSIS — Z79899 Other long term (current) drug therapy: Secondary | ICD-10-CM | POA: Diagnosis not present

## 2019-05-30 DIAGNOSIS — T40601A Poisoning by unspecified narcotics, accidental (unintentional), initial encounter: Secondary | ICD-10-CM

## 2019-05-30 DIAGNOSIS — T50904A Poisoning by unspecified drugs, medicaments and biological substances, undetermined, initial encounter: Secondary | ICD-10-CM | POA: Diagnosis present

## 2019-05-30 LAB — CBC
HCT: 38.4 % — ABNORMAL LOW (ref 39.0–52.0)
Hemoglobin: 12.1 g/dL — ABNORMAL LOW (ref 13.0–17.0)
MCH: 26.4 pg (ref 26.0–34.0)
MCHC: 31.5 g/dL (ref 30.0–36.0)
MCV: 83.8 fL (ref 80.0–100.0)
Platelets: 266 10*3/uL (ref 150–400)
RBC: 4.58 MIL/uL (ref 4.22–5.81)
RDW: 14.6 % (ref 11.5–15.5)
WBC: 5.7 10*3/uL (ref 4.0–10.5)
nRBC: 0 % (ref 0.0–0.2)

## 2019-05-30 LAB — RAPID URINE DRUG SCREEN, HOSP PERFORMED
Amphetamines: NOT DETECTED
Barbiturates: NOT DETECTED
Benzodiazepines: POSITIVE — AB
Cocaine: POSITIVE — AB
Opiates: POSITIVE — AB
Tetrahydrocannabinol: POSITIVE — AB

## 2019-05-30 LAB — COMPREHENSIVE METABOLIC PANEL
ALT: 22 U/L (ref 0–44)
AST: 20 U/L (ref 15–41)
Albumin: 4.4 g/dL (ref 3.5–5.0)
Alkaline Phosphatase: 75 U/L (ref 38–126)
Anion gap: 8 (ref 5–15)
BUN: 13 mg/dL (ref 6–20)
CO2: 27 mmol/L (ref 22–32)
Calcium: 9 mg/dL (ref 8.9–10.3)
Chloride: 105 mmol/L (ref 98–111)
Creatinine, Ser: 1.26 mg/dL — ABNORMAL HIGH (ref 0.61–1.24)
GFR calc Af Amer: >60 mL/min (ref >60)
GFR calc non Af Amer: >60 mL/min (ref >60)
Glucose, Bld: 89 mg/dL (ref 70–99)
Potassium: 3.6 mmol/L (ref 3.5–5.1)
Sodium: 140 mmol/L (ref 135–145)
Total Bilirubin: 0.7 mg/dL (ref 0.3–1.2)
Total Protein: 7.5 g/dL (ref 6.5–8.1)

## 2019-05-30 LAB — ETHANOL: Alcohol, Ethyl (B): <10 mg/dL (ref <10)

## 2019-05-30 NOTE — ED Triage Notes (Signed)
Pt coming from [facility] [address] after overdosing on oxycodone and etoh. Reports "3 of each". Unknown mg or actual amount. Received 1mg  of narcan. Pt lethargic upon arrival.

## 2019-05-30 NOTE — ED Notes (Signed)
Urine and culture sent to lab  
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
## 2019-05-31 LAB — SALICYLATE LEVEL: Salicylate Lvl: <7 mg/dL — ABNORMAL LOW (ref 7.0–30.0)

## 2019-05-31 LAB — ACETAMINOPHEN LEVEL: Acetaminophen (Tylenol), Serum: 15 ug/mL (ref 10–30)

## 2019-05-31 NOTE — ED Notes (Signed)
Patient given Ginger Ale 

## 2019-05-31 NOTE — Discharge Instructions (Signed)
Make sure to take your oxycodone only as prescribed - do not take any extra. If you need extra pain relief, take acetaminophen and/or ibuprofen.

## 2019-06-30 ENCOUNTER — Ambulatory Visit: Payer: BC Managed Care – PPO | Admitting: Registered"

## 2019-10-31 ENCOUNTER — Encounter (HOSPITAL_COMMUNITY): Payer: Self-pay | Admitting: Emergency Medicine

## 2019-10-31 ENCOUNTER — Emergency Department (HOSPITAL_COMMUNITY)
Admission: EM | Admit: 2019-10-31 | Discharge: 2019-11-04 | Disposition: E | Payer: BC Managed Care – PPO | Attending: Emergency Medicine | Admitting: Emergency Medicine

## 2019-10-31 DIAGNOSIS — I469 Cardiac arrest, cause unspecified: Secondary | ICD-10-CM | POA: Insufficient documentation

## 2019-10-31 DIAGNOSIS — I10 Essential (primary) hypertension: Secondary | ICD-10-CM | POA: Insufficient documentation

## 2019-10-31 MED ORDER — NALOXONE HCL 2 MG/2ML IJ SOSY
PREFILLED_SYRINGE | INTRAMUSCULAR | Status: AC
  Filled 2019-10-31: qty 2

## 2019-10-31 MED ORDER — NALOXONE HCL 2 MG/2ML IJ SOSY: 2.0000 mg | PREFILLED_SYRINGE | Freq: Once | INTRAMUSCULAR | Status: AC

## 2019-10-31 MED ORDER — EPINEPHRINE 1 MG/10ML IJ SOSY
PREFILLED_SYRINGE | INTRAMUSCULAR | Status: AC | PRN
  Administered 2019-10-31 (×2): 1 mg via INTRAVENOUS

## 2019-10-31 MED FILL — Medication: Qty: 1 | Status: AC
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
## 2019-11-04 NOTE — ED Notes (Signed)
Chest compressions continues / intubated by EDP , asystole continues after 2 Epinephrine IV. Pronounced dead by Dr. Bebe Shaggy : time of death 0204.

## 2019-11-04 NOTE — ED Triage Notes (Signed)
Patient arrived with EMS from home CPR in progress , received 5 doses of Epinephrine IV by EMS . 1/2 ampule D10% for CBG=49. CPR continues at arrival .

## 2019-11-04 NOTE — ED Notes (Signed)
Transported to morgue.

## 2019-11-04 NOTE — ED Notes (Signed)
[facility] notified , Reference [phone] .

## 2019-11-04 NOTE — ED Notes (Signed)
Post mortem care rendered , EDP advised RN that patient will be a medical examiner case.

## 2019-11-04 DEATH — deceased
